# Patient Record
Sex: Male | Born: 1985 | Race: Black or African American | Hispanic: No | Marital: Single | Smoking: Never smoker
Health system: Southern US, Community
[De-identification: ages and names within clinical notes are randomized; demographics above are authoritative.]

## PROBLEM LIST (undated history)

## (undated) HISTORY — PX: LEG SURGERY: SHX1003

---

## 2016-03-24 ENCOUNTER — Ambulatory Visit (INDEPENDENT_AMBULATORY_CARE_PROVIDER_SITE_OTHER): Payer: No Typology Code available for payment source | Admitting: Family Medicine

## 2016-03-24 VITALS — BP 130/90 | HR 93 | Temp 99.1°F | Resp 16 | Ht 71.0 in | Wt 194.0 lb

## 2016-03-24 DIAGNOSIS — R509 Fever, unspecified: Secondary | ICD-10-CM | POA: Diagnosis not present

## 2016-03-24 DIAGNOSIS — R05 Cough: Secondary | ICD-10-CM

## 2016-03-24 DIAGNOSIS — R059 Cough, unspecified: Secondary | ICD-10-CM

## 2016-03-24 LAB — POCT INFLUENZA A/B
INFLUENZA B, POC: NEGATIVE
Influenza A, POC: NEGATIVE

## 2016-03-24 MED ORDER — AZITHROMYCIN 250 MG PO TABS: ORAL_TABLET | ORAL | Status: AC

## 2016-03-24 NOTE — Progress Notes (Addendum)
By signing my name below I, Shelah Lewandowsky, attest that this documentation has been prepared under the direction and in the presence of Shade Flood, MD. Electonically Signed. Shelah Lewandowsky, Scribe 03/24/2016 at 1:15 PM   Subjective:    Patient ID: Daniel Singh, male    DOB: 1986-10-10, 30 y.o.   MRN: [id number]  Chief Complaint  Patient presents with  . Cough    x1week  . Fever  . Nasal Congestion    HPI Daniel Singh is a 30 y.o. male who presents to the [facility] complaining of cough for the past 5 days. Cough has been productive. Pt also reports chest congestion. Fever started 3 days ago and has been constant since onset. Pt states fever has been measured around 100 and highest has been 101. Pt denies feeling SOB or runny nose.  Pt has a few co-workers that are sick. Pt's daughter and girlfriend both have similar cough and fever symptoms.  Pt works night shift and symptoms have been worsening at night.   There are no active problems to display for this patient.  No past medical history on file. No past surgical history on file. Allergies  Allergen Reactions  . Shellfish Allergy    Prior to Admission medications   Not on File   Social History   Social History  . Marital Status: Single    Spouse Name: N/A  . Number of Children: N/A  . Years of Education: N/A   Occupational History  . Not on file.   Social History Main Topics  . Smoking status: Never Smoker   . Smokeless tobacco: Not on file  . Alcohol Use: Not on file  . Drug Use: Not on file  . Sexual Activity: Not on file   Other Topics Concern  . Not on file   Social History Narrative  . No narrative on file      Review of Systems  Constitutional: Positive for fever.  HENT: Negative for rhinorrhea.   Respiratory: Positive for cough. Negative for shortness of breath.        Objective:   Physical Exam  Constitutional: He is oriented to person, place, and time.  He appears well-developed and well-nourished.  HENT:  Head: Normocephalic and atraumatic.  Right Ear: Tympanic membrane, external ear and ear canal normal.  Left Ear: Tympanic membrane, external ear and ear canal normal.  Nose: No rhinorrhea. Right sinus exhibits no maxillary sinus tenderness and no frontal sinus tenderness. Left sinus exhibits no maxillary sinus tenderness and no frontal sinus tenderness.  Mouth/Throat: Oropharynx is clear and moist and mucous membranes are normal. No oropharyngeal exudate or posterior oropharyngeal erythema.  Eyes: Conjunctivae are normal. Pupils are equal, round, and reactive to light.  Neck: Neck supple.  Cardiovascular: Normal rate, regular rhythm, normal heart sounds and intact distal pulses.  Exam reveals no gallop and no friction rub.   No murmur heard. Pulmonary/Chest: Effort normal and breath sounds normal. No accessory muscle usage. No respiratory distress. He has no decreased breath sounds. He has no wheezes. He has no rhonchi. He has no rales.  Abdominal: Soft. There is no tenderness.  Musculoskeletal:       Right lower leg: He exhibits no tenderness and no swelling.       Left lower leg: He exhibits no tenderness and no swelling.  Lymphadenopathy:    He has no cervical adenopathy.  Neurological: He is alert and oriented to person, place, and time.  Skin: Skin is warm and dry. No rash noted.  Psychiatric: He has a normal mood and affect. His behavior is normal.  Vitals reviewed.    Filed Vitals:   03/24/16 1051  BP: 130/90  Pulse: 93  Temp: 99.1 F (37.3 C)  TempSrc: Oral  Resp: 16  Height: 5\' 11"  (1.803 m)  Weight: 194 lb (87.998 kg)  SpO2: 98%   Results for orders placed or performed in visit on 03/24/16  POCT Influenza A/B  Result Value Ref Range   Influenza A, POC Negative Negative   Influenza B, POC Negative Negative      Assessment & Plan:   Daniel Singh is a 30 y.o. male Cough - Plan: POCT Influenza A/B,  azithromycin (ZITHROMAX) 250 MG tablet  Fever, unspecified - Plan: POCT Influenza A/B, azithromycin (ZITHROMAX) 250 MG tablet  Suspected viral illness with multiple sick contacts, lungs clear exam today. However with persistent fever and cough, early community acquired pneumonia also possible.  -Symptomatically care with Mucinex, antipyretics, fluids and rest. If not improving the next few days, did prescribe azithromycin Z-Pak to fill. However if worsening including shortness of breath, return here or other provider as may need chest x-ray or other testing.  Meds ordered this encounter  Medications  . azithromycin (ZITHROMAX) 250 MG tablet    Sig: Take 2 pills by mouth on day 1, then 1 pill by mouth per day on days 2 through 5.    Dispense:  6 tablet    Refill:  0   Patient Instructions       IF you received an x-ray today, you will receive an invoice from [facility]. Please contact [facility] at [phone] with questions or concerns regarding your invoice.   IF you received labwork today, you will receive an invoice from [employer]. Please contact [facility] at [phone] with questions or concerns regarding your invoice.   Our billing staff will not be able to assist you with questions regarding bills from these companies.  You will be contacted with the lab results as soon as they are available. The fastest way to get your results is to activate your My Chart account. Instructions are located on the last page of this paperwork. If you have not heard from [place] regarding the results in 2 weeks, please contact this office.    Your flu test was negative, and lungs overall sound clear today. Mucinex or Mucinex DM as needed for cough, Tylenol or Motrin as needed for body aches. If your fever is not improving in the next few days and cough is not improving the next few days, I did prescribe an antibiotic. However if you're getting short of  breath, or worsening symptoms recommend return to clinic or other medical provider for further evaluation and possible chest x-ray.  Fever, Adult A fever is an increase in the body's temperature. It is usually defined as a temperature of 100F (38C) or higher. Brief mild or moderate fevers generally have no long-term effects, and they often do not require treatment. Moderate or high fevers may make you feel uncomfortable and can sometimes be a sign of a serious illness or disease. The sweating that may occur with repeated or prolonged fever may also cause dehydration. Fever is confirmed by taking a temperature with a thermometer. A measured temperature can vary with:  Age.  Time of day.  Location of the thermometer:  Mouth (oral).  Rectum (rectal).  Ear (tympanic).  Underarm (axillary).  Forehead (  temporal). HOME CARE INSTRUCTIONS Pay attention to any changes in your symptoms. Take these actions to help with your condition:  Take over-the counter and prescription medicines only as told by your health care provider. Follow the dosing instructions carefully.  If you were prescribed an antibiotic medicine, take it as told by your health care provider. Do not stop taking the antibiotic even if you start to feel better.  Rest as needed.  Drink enough fluid to keep your urine clear or pale yellow. This helps to prevent dehydration.  Sponge yourself or bathe with room-temperature water to help reduce your body temperature as needed. Do not use ice water.  Do not overbundle yourself in blankets or heavy clothes. SEEK MEDICAL CARE IF:  You vomit.  You cannot eat or drink without vomiting.  You have diarrhea.  You have pain when you urinate.  Your symptoms do not improve with treatment.  You develop new symptoms.  You develop excessive weakness. SEEK IMMEDIATE MEDICAL CARE IF:  You have shortness of breath or have trouble breathing.  You are dizzy or you faint.  You are  disoriented or confused.  You develop signs of dehydration, such as a dry mouth, decreased urination, or paleness.  You develop severe pain in your abdomen.  You have persistent vomiting or diarrhea.  You develop a skin rash.  Your symptoms suddenly get worse.   This information is not intended to replace advice given to you by your health care provider. Make sure you discuss any questions you have with your health care provider.   Document Released: 03/31/2001 Document Revised: 06/26/2015 Document Reviewed: 11/29/2014 Elsevier Interactive Patient Education 2016 Elsevier Inc. Cough, Adult Coughing is a reflex that clears your throat and your airways. Coughing helps to heal and protect your lungs. It is normal to cough occasionally, but a cough that happens with other symptoms or lasts a long time may be a sign of a condition that needs treatment. A cough may last only 2-3 weeks (acute), or it may last longer than 8 weeks (chronic). CAUSES Coughing is commonly caused by:  Breathing in substances that irritate your lungs.  A viral or bacterial respiratory infection.  Allergies.  Asthma.  Postnasal drip.  Smoking.  Acid backing up from the stomach into the esophagus (gastroesophageal reflux).  Certain medicines.  Chronic lung problems, including COPD (or rarely, lung cancer).  Other medical conditions such as heart failure. HOME CARE INSTRUCTIONS  Pay attention to any changes in your symptoms. Take these actions to help with your discomfort:  Take medicines only as told by your health care provider.  If you were prescribed an antibiotic medicine, take it as told by your health care provider. Do not stop taking the antibiotic even if you start to feel better.  Talk with your health care provider before you take a cough suppressant medicine.  Drink enough fluid to keep your urine clear or pale yellow.  If the air is dry, use a cold steam vaporizer or humidifier in your  bedroom or your home to help loosen secretions.  Avoid anything that causes you to cough at work or at home.  If your cough is worse at night, try sleeping in a semi-upright position.  Avoid cigarette smoke. If you smoke, quit smoking. If you need help quitting, ask your health care provider.  Avoid caffeine.  Avoid alcohol.  Rest as needed. SEEK MEDICAL CARE IF:   You have new symptoms.  You cough up pus.  Your  cough does not get better after 2-3 weeks, or your cough gets worse.  You cannot control your cough with suppressant medicines and you are losing sleep.  You develop pain that is getting worse or pain that is not controlled with pain medicines.  You have a fever.  You have unexplained weight loss.  You have night sweats. SEEK IMMEDIATE MEDICAL CARE IF:  You cough up blood.  You have difficulty breathing.  Your heartbeat is very fast.   This information is not intended to replace advice given to you by your health care provider. Make sure you discuss any questions you have with your health care provider.   Document Released: 04/03/2011 Document Revised: 06/26/2015 Document Reviewed: 12/12/2014 Elsevier Interactive Patient Education [employer]     I personally performed the services described in this documentation, which was scribed in my presence. The recorded information has been reviewed and considered, and addended by me as needed.   Signed,   Meredith Staggers, MD [facility].  03/24/2016 1:34 PM

## 2016-03-24 NOTE — Patient Instructions (Addendum)
IF you received an x-ray today, you will receive an invoice from [facility]. Please contact [facility] at [phone] with questions or concerns regarding your invoice.   IF you received labwork today, you will receive an invoice from [employer]. Please contact [facility] at [phone] with questions or concerns regarding your invoice.   Our billing staff will not be able to assist you with questions regarding bills from these companies.  You will be contacted with the lab results as soon as they are available. The fastest way to get your results is to activate your My Chart account. Instructions are located on the last page of this paperwork. If you have not heard from [place] regarding the results in 2 weeks, please contact this office.    Your flu test was negative, and lungs overall sound clear today. Mucinex or Mucinex DM as needed for cough, Tylenol or Motrin as needed for body aches. If your fever is not improving in the next few days and cough is not improving the next few days, I did prescribe an antibiotic. However if you're getting short of breath, or worsening symptoms recommend return to clinic or other medical provider for further evaluation and possible chest x-ray.  Fever, Adult A fever is an increase in the body's temperature. It is usually defined as a temperature of 100F (38C) or higher. Brief mild or moderate fevers generally have no long-term effects, and they often do not require treatment. Moderate or high fevers may make you feel uncomfortable and can sometimes be a sign of a serious illness or disease. The sweating that may occur with repeated or prolonged fever may also cause dehydration. Fever is confirmed by taking a temperature with a thermometer. A measured temperature can vary with:  Age.  Time of day.  Location of the thermometer:  Mouth (oral).  Rectum (rectal).  Ear (tympanic).  Underarm  (axillary).  Forehead (temporal). HOME CARE INSTRUCTIONS Pay attention to any changes in your symptoms. Take these actions to help with your condition:  Take over-the counter and prescription medicines only as told by your health care provider. Follow the dosing instructions carefully.  If you were prescribed an antibiotic medicine, take it as told by your health care provider. Do not stop taking the antibiotic even if you start to feel better.  Rest as needed.  Drink enough fluid to keep your urine clear or pale yellow. This helps to prevent dehydration.  Sponge yourself or bathe with room-temperature water to help reduce your body temperature as needed. Do not use ice water.  Do not overbundle yourself in blankets or heavy clothes. SEEK MEDICAL CARE IF:  You vomit.  You cannot eat or drink without vomiting.  You have diarrhea.  You have pain when you urinate.  Your symptoms do not improve with treatment.  You develop new symptoms.  You develop excessive weakness. SEEK IMMEDIATE MEDICAL CARE IF:  You have shortness of breath or have trouble breathing.  You are dizzy or you faint.  You are disoriented or confused.  You develop signs of dehydration, such as a dry mouth, decreased urination, or paleness.  You develop severe pain in your abdomen.  You have persistent vomiting or diarrhea.  You develop a skin rash.  Your symptoms suddenly get worse.   This information is not intended to replace advice given to you by your health care provider. Make sure you discuss any questions you have with your health care provider.  Document Released: 03/31/2001 Document Revised: 06/26/2015 Document Reviewed: 11/29/2014 Elsevier Interactive Patient Education 2016 Elsevier Inc. Cough, Adult Coughing is a reflex that clears your throat and your airways. Coughing helps to heal and protect your lungs. It is normal to cough occasionally, but a cough that happens with other symptoms  or lasts a long time may be a sign of a condition that needs treatment. A cough may last only 2-3 weeks (acute), or it may last longer than 8 weeks (chronic). CAUSES Coughing is commonly caused by:  Breathing in substances that irritate your lungs.  A viral or bacterial respiratory infection.  Allergies.  Asthma.  Postnasal drip.  Smoking.  Acid backing up from the stomach into the esophagus (gastroesophageal reflux).  Certain medicines.  Chronic lung problems, including COPD (or rarely, lung cancer).  Other medical conditions such as heart failure. HOME CARE INSTRUCTIONS  Pay attention to any changes in your symptoms. Take these actions to help with your discomfort:  Take medicines only as told by your health care provider.  If you were prescribed an antibiotic medicine, take it as told by your health care provider. Do not stop taking the antibiotic even if you start to feel better.  Talk with your health care provider before you take a cough suppressant medicine.  Drink enough fluid to keep your urine clear or pale yellow.  If the air is dry, use a cold steam vaporizer or humidifier in your bedroom or your home to help loosen secretions.  Avoid anything that causes you to cough at work or at home.  If your cough is worse at night, try sleeping in a semi-upright position.  Avoid cigarette smoke. If you smoke, quit smoking. If you need help quitting, ask your health care provider.  Avoid caffeine.  Avoid alcohol.  Rest as needed. SEEK MEDICAL CARE IF:   You have new symptoms.  You cough up pus.  Your cough does not get better after 2-3 weeks, or your cough gets worse.  You cannot control your cough with suppressant medicines and you are losing sleep.  You develop pain that is getting worse or pain that is not controlled with pain medicines.  You have a fever.  You have unexplained weight loss.  You have night sweats. SEEK IMMEDIATE MEDICAL CARE  IF:  You cough up blood.  You have difficulty breathing.  Your heartbeat is very fast.   This information is not intended to replace advice given to you by your health care provider. Make sure you discuss any questions you have with your health care provider.   Document Released: 04/03/2011 Document Revised: 06/26/2015 Document Reviewed: 12/12/2014 Elsevier Interactive Patient Education [employer] Elsevier Inc[employer]

## 2018-02-02 ENCOUNTER — Encounter (HOSPITAL_COMMUNITY): Payer: Self-pay | Admitting: Emergency Medicine

## 2018-02-02 ENCOUNTER — Emergency Department (HOSPITAL_COMMUNITY): Payer: PRIVATE HEALTH INSURANCE

## 2018-02-02 ENCOUNTER — Emergency Department (HOSPITAL_COMMUNITY)
Admission: EM | Admit: 2018-02-02 | Discharge: 2018-02-02 | Disposition: A | Discharge status: Home / Self Care | Payer: PRIVATE HEALTH INSURANCE | Attending: Emergency Medicine | Admitting: Emergency Medicine

## 2018-02-02 ENCOUNTER — Other Ambulatory Visit: Payer: Self-pay

## 2018-02-02 DIAGNOSIS — Y929 Unspecified place or not applicable: Secondary | ICD-10-CM | POA: Diagnosis not present

## 2018-02-02 DIAGNOSIS — Y999 Unspecified external cause status: Secondary | ICD-10-CM | POA: Diagnosis not present

## 2018-02-02 DIAGNOSIS — S60512A Abrasion of left hand, initial encounter: Secondary | ICD-10-CM | POA: Diagnosis not present

## 2018-02-02 DIAGNOSIS — S50812A Abrasion of left forearm, initial encounter: Secondary | ICD-10-CM | POA: Insufficient documentation

## 2018-02-02 DIAGNOSIS — S50811A Abrasion of right forearm, initial encounter: Secondary | ICD-10-CM | POA: Diagnosis not present

## 2018-02-02 DIAGNOSIS — S60511A Abrasion of right hand, initial encounter: Secondary | ICD-10-CM | POA: Diagnosis not present

## 2018-02-02 DIAGNOSIS — M545 Low back pain: Secondary | ICD-10-CM | POA: Diagnosis not present

## 2018-02-02 DIAGNOSIS — R1031 Right lower quadrant pain: Secondary | ICD-10-CM | POA: Insufficient documentation

## 2018-02-02 DIAGNOSIS — Y939 Activity, unspecified: Secondary | ICD-10-CM | POA: Diagnosis not present

## 2018-02-02 DIAGNOSIS — T07XXXA Unspecified multiple injuries, initial encounter: Secondary | ICD-10-CM

## 2018-02-02 DIAGNOSIS — Z041 Encounter for examination and observation following transport accident: Secondary | ICD-10-CM | POA: Diagnosis present

## 2018-02-02 HISTORY — DX: Rider (driver) (passenger) of other motorcycle injured in unspecified traffic accident, initial encounter: V29.99XA

## 2018-02-02 LAB — CBC WITH DIFFERENTIAL/PLATELET
Basophils Absolute: 0.1 10*3/uL (ref 0.0–0.1)
Basophils Relative: 0 %
Eosinophils Absolute: 0.1 10*3/uL (ref 0.0–0.7)
Eosinophils Relative: 1 %
HCT: 40.3 % (ref 39.0–52.0)
Hemoglobin: 14 g/dL (ref 13.0–17.0)
Lymphocytes Relative: 20 %
Lymphs Abs: 2.3 10*3/uL (ref 0.7–4.0)
MCH: 30 pg (ref 26.0–34.0)
MCHC: 34.7 g/dL (ref 30.0–36.0)
MCV: 86.5 fL (ref 78.0–100.0)
Monocytes Absolute: 0.7 10*3/uL (ref 0.1–1.0)
Monocytes Relative: 7 %
Neutro Abs: 8.2 10*3/uL — ABNORMAL HIGH (ref 1.7–7.7)
Neutrophils Relative %: 72 %
Platelets: 274 10*3/uL (ref 150–400)
RBC: 4.66 MIL/uL (ref 4.22–5.81)
RDW: 12.7 % (ref 11.5–15.5)
WBC: 11.3 10*3/uL — ABNORMAL HIGH (ref 4.0–10.5)

## 2018-02-02 LAB — BASIC METABOLIC PANEL
Anion gap: 10 (ref 5–15)
BUN: 14 mg/dL (ref 6–20)
CO2: 23 mmol/L (ref 22–32)
Calcium: 9.7 mg/dL (ref 8.9–10.3)
Chloride: 107 mmol/L (ref 101–111)
Creatinine, Ser: 1.09 mg/dL (ref 0.61–1.24)
GFR calc Af Amer: >60 mL/min (ref >60)
GFR calc non Af Amer: >60 mL/min (ref >60)
Glucose, Bld: 98 mg/dL (ref 65–99)
Potassium: 3.8 mmol/L (ref 3.5–5.1)
Sodium: 140 mmol/L (ref 135–145)

## 2018-02-02 MED ORDER — ONDANSETRON HCL 4 MG/2ML IJ SOLN
4.0000 mg | Freq: Once | INTRAMUSCULAR | Status: AC
  Administered 2018-02-02: 4 mg via INTRAVENOUS
  Filled 2018-02-02: qty 2

## 2018-02-02 MED ORDER — IOPAMIDOL (ISOVUE-300) INJECTION 61%
INTRAVENOUS | Status: AC
  Filled 2018-02-02: qty 100

## 2018-02-02 MED ORDER — MORPHINE SULFATE (PF) 4 MG/ML IV SOLN
6.0000 mg | Freq: Once | INTRAVENOUS | Status: AC
  Administered 2018-02-02: 6 mg via INTRAVENOUS
  Filled 2018-02-02: qty 2

## 2018-02-02 MED ORDER — TRAMADOL HCL 50 MG PO TABS: 50.0000 mg | ORAL_TABLET | Freq: Four times a day (QID) | ORAL | 0 refills | Status: AC | PRN

## 2018-02-02 MED ORDER — SODIUM CHLORIDE 0.9 % IV BOLUS
1000.0000 mL | Freq: Once | INTRAVENOUS | Status: AC
  Administered 2018-02-02: 1000 mL via INTRAVENOUS

## 2018-02-02 MED ORDER — IOPAMIDOL (ISOVUE-300) INJECTION 61%
100.0000 mL | Freq: Once | INTRAVENOUS | Status: AC | PRN
  Administered 2018-02-02: 100 mL via INTRAVENOUS

## 2018-02-02 NOTE — ED Notes (Signed)
Patient transported to X-ray 
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
